# Patient Record
Sex: Female | Born: 1980 | Hispanic: No | Marital: Single | Smoking: Never smoker
Health system: Southern US, Community
[De-identification: ages and names within clinical notes are randomized; demographics above are authoritative.]

## PROBLEM LIST (undated history)

## (undated) ENCOUNTER — Inpatient Hospital Stay (HOSPITAL_COMMUNITY): Payer: Self-pay

## (undated) DIAGNOSIS — I959 Hypotension, unspecified: Secondary | ICD-10-CM

## (undated) HISTORY — PX: OVARIAN CYST REMOVAL: SHX89

---

## 2017-09-06 ENCOUNTER — Inpatient Hospital Stay (HOSPITAL_COMMUNITY): Payer: Self-pay

## 2017-09-06 ENCOUNTER — Other Ambulatory Visit: Payer: Self-pay

## 2017-09-06 ENCOUNTER — Inpatient Hospital Stay (HOSPITAL_COMMUNITY)
Admission: AD | Admit: 2017-09-06 | Discharge: 2017-09-06 | Disposition: A | Discharge status: Home / Self Care | Payer: Self-pay | Source: Ambulatory Visit | Attending: Obstetrics and Gynecology | Admitting: Obstetrics and Gynecology

## 2017-09-06 ENCOUNTER — Encounter (HOSPITAL_COMMUNITY): Payer: Self-pay | Admitting: *Deleted

## 2017-09-06 DIAGNOSIS — O26893 Other specified pregnancy related conditions, third trimester: Secondary | ICD-10-CM

## 2017-09-06 DIAGNOSIS — O0933 Supervision of pregnancy with insufficient antenatal care, third trimester: Secondary | ICD-10-CM | POA: Insufficient documentation

## 2017-09-06 DIAGNOSIS — R109 Unspecified abdominal pain: Secondary | ICD-10-CM

## 2017-09-06 DIAGNOSIS — K5900 Constipation, unspecified: Secondary | ICD-10-CM

## 2017-09-06 DIAGNOSIS — Z3A31 31 weeks gestation of pregnancy: Secondary | ICD-10-CM | POA: Insufficient documentation

## 2017-09-06 HISTORY — DX: Hypotension, unspecified: I95.9

## 2017-09-06 LAB — CBC WITH DIFFERENTIAL/PLATELET
Basophils Absolute: 0 10*3/uL (ref 0.0–0.1)
Basophils Relative: 0 %
Eosinophils Absolute: 0.2 10*3/uL (ref 0.0–0.7)
Eosinophils Relative: 2 %
HEMATOCRIT: 31.4 % — AB (ref 36.0–46.0)
HEMOGLOBIN: 10.3 g/dL — AB (ref 12.0–15.0)
LYMPHS ABS: 2.2 10*3/uL (ref 0.7–4.0)
Lymphocytes Relative: 27 %
MCH: 30 pg (ref 26.0–34.0)
MCHC: 32.8 g/dL (ref 30.0–36.0)
MCV: 91.5 fL (ref 78.0–100.0)
MONOS PCT: 6 %
Monocytes Absolute: 0.4 10*3/uL (ref 0.1–1.0)
NEUTROS ABS: 5.3 10*3/uL (ref 1.7–7.7)
NEUTROS PCT: 65 %
Platelets: 232 10*3/uL (ref 150–400)
RBC: 3.43 MIL/uL — ABNORMAL LOW (ref 3.87–5.11)
RDW: 13.4 % (ref 11.5–15.5)
WBC: 8 10*3/uL (ref 4.0–10.5)

## 2017-09-06 LAB — WET PREP, GENITAL
Clue Cells Wet Prep HPF POC: NONE SEEN
SPERM: NONE SEEN
Trich, Wet Prep: NONE SEEN
Yeast Wet Prep HPF POC: NONE SEEN

## 2017-09-06 LAB — URINALYSIS, ROUTINE W REFLEX MICROSCOPIC
BILIRUBIN URINE: NEGATIVE
GLUCOSE, UA: NEGATIVE mg/dL
Hgb urine dipstick: NEGATIVE
Ketones, ur: NEGATIVE mg/dL
LEUKOCYTES UA: NEGATIVE
NITRITE: NEGATIVE
PH: 7 (ref 5.0–8.0)
Protein, ur: NEGATIVE mg/dL
SPECIFIC GRAVITY, URINE: 1.011 (ref 1.005–1.030)

## 2017-09-06 MED ORDER — POLYETHYLENE GLYCOL 3350 17 GM/SCOOP PO POWD: 1.0000 | Freq: Once | ORAL | 0 refills | Status: AC

## 2017-09-06 MED ORDER — PRENATAL PLUS 27-1 MG PO TABS: 1.0000 | ORAL_TABLET | Freq: Every day | ORAL | 0 refills | Status: AC

## 2017-09-06 NOTE — Discharge Instructions (Signed)
Constipation, Adult °Constipation is when a person: °· Poops (has a bowel movement) fewer times in a week than normal. °· Has a hard time pooping. °· Has poop that is dry, hard, or bigger than normal. ° °Follow these instructions at home: °Eating and drinking ° °· Eat foods that have a lot of fiber, such as: °? Fresh fruits and vegetables. °? Whole grains. °? Beans. °· Eat less of foods that are high in fat, low in fiber, or overly processed, such as: °? French fries. °? Hamburgers. °? Cookies. °? Candy. °? Soda. °· Drink enough fluid to keep your pee (urine) clear or pale yellow. °General instructions °· Exercise regularly or as told by your doctor. °· Go to the restroom when you feel like you need to poop. Do not hold it in. °· Take over-the-counter and prescription medicines only as told by your doctor. These include any fiber supplements. °· Do pelvic floor retraining exercises, such as: °? Doing deep breathing while relaxing your lower belly (abdomen). °? Relaxing your pelvic floor while pooping. °· Watch your condition for any changes. °· Keep all follow-up visits as told by your doctor. This is important. °Contact a doctor if: °· You have pain that gets worse. °· You have a fever. °· You have not pooped for 4 days. °· You throw up (vomit). °· You are not hungry. °· You lose weight. °· You are bleeding from the anus. °· You have thin, pencil-like poop (stool). °Get help right away if: °· You have a fever, and your symptoms suddenly get worse. °· You leak poop or have blood in your poop. °· Your belly feels hard or bigger than normal (is bloated). °· You have very bad belly pain. °· You feel dizzy or you faint. °This information is not intended to replace advice given to you by your health care provider. Make sure you discuss any questions you have with your health care provider. °Document Released: 03/25/2008 Document Revised: 04/26/2016 Document Reviewed: 03/27/2016 °Elsevier Interactive Patient Education ©  2017 Elsevier Inc. ° °

## 2017-09-06 NOTE — MAU Note (Signed)
Pain in lower abd, down to her privates.  Pain started about 2 wks ago.  Comes and goes. 1st preg. No bleeding.  Clear d/c- 5 days. (pants have been wet).  Arrived from [place] on 11/14; plans to stay until the  Baby is 3 months old.

## 2017-09-06 NOTE — MAU Provider Note (Signed)
History   pt is G1 @ apx 31 wks recently here from [place] in with sharp shooting abd pains into vagina and rectum. Pt states has not had bowel movement in 3-4 days. Also only had 1/2 cup juice and sips of water today. Denies vag bleeding or ROM.  CSN: [id number]  Arrival date & time 09/06/17  1659   None     No chief complaint on file.   HPI  Past Medical History:  Diagnosis Date  . Hypotension     Past Surgical History:  Procedure Laterality Date  . OVARIAN CYST REMOVAL      No family history on file.  Social History   Tobacco Use  . Smoking status: Never Smoker  . Smokeless tobacco: Never Used  Substance Use Topics  . Alcohol use: No    Frequency: Never  . Drug use: No    OB History    Gravida Para Term Preterm AB Living   1             SAB TAB Ectopic Multiple Live Births                  Review of Systems  Constitutional: Negative.   HENT: Negative.   Eyes: Negative.   Respiratory: Negative.   Cardiovascular: Negative.   Gastrointestinal: Positive for constipation.  Endocrine: Negative.   Genitourinary: Positive for vaginal discharge and vaginal pain.  Musculoskeletal: Negative.   Skin: Negative.   Allergic/Immunologic: Negative.   Neurological: Negative.   Hematological: Negative.   Psychiatric/Behavioral: Negative.     Allergies  Patient has no known allergies.  Home Medications    BP 109/89 (BP Location: Right Arm)   Pulse 88   Temp 98.3 F (36.8 C) (Oral)   Resp 16   Wt 170 lb 4 oz (77.2 kg)   SpO2 100%   Physical Exam  Constitutional: She is oriented to person, place, and time. She appears well-developed and well-nourished.  HENT:  Head: Normocephalic.  Eyes: Pupils are equal, round, and reactive to light.  Neck: Normal range of motion.  Cardiovascular: Normal rate, regular rhythm, normal heart sounds and intact distal pulses.  Pulmonary/Chest: Effort normal and breath sounds normal.  Abdominal: Soft. Bowel sounds are normal.   Genitourinary: Vagina normal and uterus normal.  Musculoskeletal: Normal range of motion.  Neurological: She is alert and oriented to person, place, and time. She has normal reflexes.  Skin: Skin is warm and dry.  Psychiatric: She has a normal mood and affect. Her behavior is normal. Judgment and thought content normal.    MAU Course  Procedures (including critical care time)  Labs Reviewed  URINE CULTURE  WET PREP, GENITAL  URINALYSIS, ROUTINE W REFLEX MICROSCOPIC  CBC WITH DIFFERENTIAL/PLATELET  RPR  HIV ANTIBODY (ROUTINE TESTING)  ABO/RH  GC/CHLAMYDIA PROBE AMP ([facility]) NOT AT [facility]   No results found.   No diagnosis found.    MDM  VSS, FHR baseline 150's mod variability no decels. 10x10 accels. Cultures obtained, wet prep, prenatal labs, us . SVE rectal vault noted to be full of stool. Cervix firm/cl/post/high. Will d/c home in stable condition. messagwe sent to clinic [facility]

## 2017-09-07 LAB — HIV ANTIBODY (ROUTINE TESTING W REFLEX): HIV Screen 4th Generation wRfx: NONREACTIVE

## 2017-09-07 LAB — RPR: RPR: NONREACTIVE

## 2017-09-07 LAB — ABO/RH: ABO/RH(D): B POS

## 2017-09-08 LAB — GC/CHLAMYDIA PROBE AMP (~~LOC~~) NOT AT ARMC
Chlamydia: NEGATIVE
Neisseria Gonorrhea: NEGATIVE

## 2017-09-08 LAB — URINE CULTURE: CULTURE: NO GROWTH

## 2017-09-29 ENCOUNTER — Encounter: Payer: Self-pay | Admitting: Obstetrics & Gynecology

## 2017-10-16 ENCOUNTER — Encounter: Payer: Self-pay | Admitting: *Deleted

## 2017-10-16 NOTE — Progress Notes (Signed)
Patient presented to the front window @1000  this morning. She has a new ob appt scheduled tomorrow, however she stated that she thinks she is leaking fluids and feels as if her abdomen is actually shrinking. States baby is moving. I advised her to proceed to MAU to be checked. Patient refused to go there, stated she went there a month ago and got a bill for $2000, she cannot pay and she is worried about accruing costs. I reassured patient that she will be seen regardless of her ability to pay. Patient stated she would be happy if she could just get an ultrasound today. I told her that when she comes to her appointment tomorrow an u/s would be ordered but that I could not schedule one before then, even so, it would not be the same day. Again, encouraged patient to go to MAU if she thinks fluid is leaking, that it is important to the health of her and her baby to be seen. Patient became tearful and walked away.

## 2017-10-17 ENCOUNTER — Encounter: Payer: Self-pay | Admitting: Obstetrics & Gynecology

## 2017-10-17 ENCOUNTER — Ambulatory Visit (INDEPENDENT_AMBULATORY_CARE_PROVIDER_SITE_OTHER): Payer: Self-pay | Admitting: Obstetrics & Gynecology

## 2017-10-17 VITALS — BP 94/63 | HR 84 | Ht 66.0 in | Wt 163.3 lb

## 2017-10-17 DIAGNOSIS — Z3403 Encounter for supervision of normal first pregnancy, third trimester: Secondary | ICD-10-CM | POA: Insufficient documentation

## 2017-10-17 DIAGNOSIS — Z113 Encounter for screening for infections with a predominantly sexual mode of transmission: Secondary | ICD-10-CM

## 2017-10-17 DIAGNOSIS — Z23 Encounter for immunization: Secondary | ICD-10-CM

## 2017-10-17 NOTE — Progress Notes (Signed)
   PRENATAL VISIT NOTE  Subjective:  Alexandria Phillips is a 36 y.o. G1P0 at 3833w4d being seen today for transfer of prenatal care from [place].  Due to language barrier, an interpreter was present during the history-taking and subsequent discussion (and for part of the physical exam) with this patient. She is currently monitored for the following issues for this low-risk pregnancy and has Encounter for supervision of normal first pregnancy in third trimester on their problem list.  Patient reports possible watery discharge for one week.  Contractions: Irregular. Vag. Bleeding: None.  Movement: Present. Denies leaking of fluid.   The following portions of the patient's history were reviewed and updated as appropriate: allergies, current medications, past family history, past medical history, past social history, past surgical history and problem list. Problem list updated.  Objective:   Vitals:   10/17/17 1428 10/17/17 1438  BP: 94/63   Pulse: 84   Weight: 163 lb 4.8 oz (74.1 kg)   Height:  5\' 6"  (1.676 m)    Fetal Status: Fetal Heart Rate (bpm): 143 Fundal Height: 37 cm Movement: Present  Presentation: Vertex  General:  Alert, oriented and cooperative. Patient is in no acute distress.  Skin: Skin is warm and dry. No rash noted.   Cardiovascular: Normal heart rate noted  Respiratory: Normal respiratory effort, no problems with respiration noted  Abdomen: Soft, gravid, appropriate for gestational age.  Pain/Pressure: Present     Pelvic: Cervical exam performed Dilation: Closed Effacement (%): Thick Station: -3. No pooling seen in vagina, just thick white discharge. Sent for analysis.  Extremities: Normal range of motion.  Edema: None  Mental Status:  Normal mood and affect. Normal behavior. Normal judgment and thought content.   Assessment and Plan:  Pregnancy: G1P0 at 733w4d  1. Flu vaccine need - Flu Vaccine QUAD 36+ mos IM (Fluarix, Quad PF)  2. Encounter for supervision  of normal first pregnancy in third trimester - Cervicovaginal ancillary only - Hepatitis B Surface AntiGEN - Rubella screen - Antibody screen; Future - Glucose Tolerance, 1 Hour - Strep Gp B NAA  The nature of [facility] with multiple MDs and other Advanced Practice Providers was explained to patient; also emphasized that residents, students are part of our team.  No sign of SROM today.  Term labor symptoms and general obstetric precautions including but not limited to vaginal bleeding, contractions, leaking of fluid and fetal movement were reviewed in detail with the patient.  Please refer to After Visit Summary for other counseling recommendations.   Return in about 1 week (around 10/24/2017) for OB Visit (LOB).   Jaynie CollinsUgonna Samyukta Cura, MD

## 2017-10-17 NOTE — Patient Instructions (Signed)
Third Trimester of Pregnancy The third trimester is from week 28 through week 40 (months 7 through 9). The third trimester is a time when the unborn baby (fetus) is growing rapidly. At the end of the ninth month, the fetus is about 20 inches in length and weighs 6-10 pounds. Body changes during your third trimester Your body will continue to go through many changes during pregnancy. The changes vary from woman to woman. During the third trimester:  Your weight will continue to increase. You can expect to gain 25-35 pounds (11-16 kg) by the end of the pregnancy.  You may begin to get stretch marks on your hips, abdomen, and breasts.  You may urinate more often because the fetus is moving lower into your pelvis and pressing on your bladder.  You may develop or continue to have heartburn. This is caused by increased hormones that slow down muscles in the digestive tract.  You may develop or continue to have constipation because increased hormones slow digestion and cause the muscles that push waste through your intestines to relax.  You may develop hemorrhoids. These are swollen veins (varicose veins) in the rectum that can itch or be painful.  You may develop swollen, bulging veins (varicose veins) in your legs.  You may have increased body aches in the pelvis, back, or thighs. This is due to weight gain and increased hormones that are relaxing your joints.  You may have changes in your hair. These can include thickening of your hair, rapid growth, and changes in texture. Some women also have hair loss during or after pregnancy, or hair that feels dry or thin. Your hair will most likely return to normal after your baby is born.  Your breasts will continue to grow and they will continue to become tender. A yellow fluid (colostrum) may leak from your breasts. This is the first milk you are producing for your baby.  Your belly button may stick out.  You may notice more swelling in your hands,  face, or ankles.  You may have increased tingling or numbness in your hands, arms, and legs. The skin on your belly may also feel numb.  You may feel short of breath because of your expanding uterus.  You may have more problems sleeping. This can be caused by the size of your belly, increased need to urinate, and an increase in your body's metabolism.  You may notice the fetus "dropping," or moving lower in your abdomen (lightening).  You may have increased vaginal discharge.  You may notice your joints feel loose and you may have pain around your pelvic bone.  What to expect at prenatal visits You will have prenatal exams every 2 weeks until week 36. Then you will have weekly prenatal exams. During a routine prenatal visit:  You will be weighed to make sure you and the baby are growing normally.  Your blood pressure will be taken.  Your abdomen will be measured to track your baby's growth.  The fetal heartbeat will be listened to.  Any test results from the previous visit will be discussed.  You may have a cervical check near your due date to see if your cervix has softened or thinned (effaced).  You will be tested for Group B streptococcus. This happens between 35 and 37 weeks.  Your health care provider may ask you:  What your birth plan is.  How you are feeling.  If you are feeling the baby move.  If you have had   any abnormal symptoms, such as leaking fluid, bleeding, severe headaches, or abdominal cramping.  If you are using any tobacco products, including cigarettes, chewing tobacco, and electronic cigarettes.  If you have any questions.  Other tests or screenings that may be performed during your third trimester include:  Blood tests that check for low iron levels (anemia).  Fetal testing to check the health, activity level, and growth of the fetus. Testing is done if you have certain medical conditions or if there are problems during the  pregnancy.  Nonstress test (NST). This test checks the health of your baby to make sure there are no signs of problems, such as the baby not getting enough oxygen. During this test, a belt is placed around your belly. The baby is made to move, and its heart rate is monitored during movement.  What is false labor? False labor is a condition in which you feel small, irregular tightenings of the muscles in the womb (contractions) that usually go away with rest, changing position, or drinking water. These are called Braxton Hicks contractions. Contractions may last for hours, days, or even weeks before true labor sets in. If contractions come at regular intervals, become more frequent, increase in intensity, or become painful, you should see your health care provider. What are the signs of labor?  Abdominal cramps.  Regular contractions that start at 10 minutes apart and become stronger and more frequent with time.  Contractions that start on the top of the uterus and spread down to the lower abdomen and back.  Increased pelvic pressure and dull back pain.  A watery or bloody mucus discharge that comes from the vagina.  Leaking of amniotic fluid. This is also known as your "water breaking." It could be a slow trickle or a gush. Let your health care provider know if it has a color or strange odor. If you have any of these signs, call your health care provider right away, even if it is before your due date. Follow these instructions at home: Medicines  Follow your health care provider's instructions regarding medicine use. Specific medicines may be either safe or unsafe to take during pregnancy.  Take a prenatal vitamin that contains at least 600 micrograms (mcg) of folic acid.  If you develop constipation, try taking a stool softener if your health care provider approves. Eating and drinking  Eat a balanced diet that includes fresh fruits and vegetables, whole grains, good sources of protein  such as meat, eggs, or tofu, and low-fat dairy. Your health care provider will help you determine the amount of weight gain that is right for you.  Avoid raw meat and uncooked cheese. These carry germs that can cause birth defects in the baby.  If you have low calcium intake from food, talk to your health care provider about whether you should take a daily calcium supplement.  Eat four or five small meals rather than three large meals a day.  Limit foods that are high in fat and processed sugars, such as fried and sweet foods.  To prevent constipation: ? Drink enough fluid to keep your urine clear or pale yellow. ? Eat foods that are high in fiber, such as fresh fruits and vegetables, whole grains, and beans. Activity  Exercise only as directed by your health care provider. Most women can continue their usual exercise routine during pregnancy. Try to exercise for 30 minutes at least 5 days a week. Stop exercising if you experience uterine contractions.  Avoid heavy   lifting.  Do not exercise in extreme heat or humidity, or at high altitudes.  Wear low-heel, comfortable shoes.  Practice good posture.  You may continue to have sex unless your health care provider tells you otherwise. Relieving pain and discomfort  Take frequent breaks and rest with your legs elevated if you have leg cramps or low back pain.  Take warm sitz baths to soothe any pain or discomfort caused by hemorrhoids. Use hemorrhoid cream if your health care provider approves.  Wear a good support bra to prevent discomfort from breast tenderness.  If you develop varicose veins: ? Wear support pantyhose or compression stockings as told by your healthcare provider. ? Elevate your feet for 15 minutes, 3-4 times a day. Prenatal care  Write down your questions. Take them to your prenatal visits.  Keep all your prenatal visits as told by your health care provider. This is important. Safety  Wear your seat belt at  all times when driving.  Make a list of emergency phone numbers, including numbers for family, friends, the hospital, and police and fire departments. General instructions  Avoid cat litter boxes and soil used by cats. These carry germs that can cause birth defects in the baby. If you have a cat, ask someone to clean the litter box for you.  Do not travel far distances unless it is absolutely necessary and only with the approval of your health care provider.  Do not use hot tubs, steam rooms, or saunas.  Do not drink alcohol.  Do not use any products that contain nicotine or tobacco, such as cigarettes and e-cigarettes. If you need help quitting, ask your health care provider.  Do not use any medicinal herbs or unprescribed drugs. These chemicals affect the formation and growth of the baby.  Do not douche or use tampons or scented sanitary pads.  Do not cross your legs for long periods of time.  To prepare for the arrival of your baby: ? Take prenatal classes to understand, practice, and ask questions about labor and delivery. ? Make a trial run to the hospital. ? Visit the hospital and tour the maternity area. ? Arrange for maternity or paternity leave through employers. ? Arrange for family and friends to take care of pets while you are in the hospital. ? Purchase a rear-facing car seat and make sure you know how to install it in your car. ? Pack your hospital bag. ? Prepare the baby's nursery. Make sure to remove all pillows and stuffed animals from the baby's crib to prevent suffocation.  Visit your dentist if you have not gone during your pregnancy. Use a soft toothbrush to brush your teeth and be gentle when you floss. Contact a health care provider if:  You are unsure if you are in labor or if your water has broken.  You become dizzy.  You have mild pelvic cramps, pelvic pressure, or nagging pain in your abdominal area.  You have lower back pain.  You have persistent  nausea, vomiting, or diarrhea.  You have an unusual or bad smelling vaginal discharge.  You have pain when you urinate. Get help right away if:  Your water breaks before 37 weeks.  You have regular contractions less than 5 minutes apart before 37 weeks.  You have a fever.  You are leaking fluid from your vagina.  You have spotting or bleeding from your vagina.  You have severe abdominal pain or cramping.  You have rapid weight loss or weight gain.    You have shortness of breath with chest pain.  You notice sudden or extreme swelling of your face, hands, ankles, feet, or legs.  Your baby makes fewer than 10 movements in 2 hours.  You have severe headaches that do not go away when you take medicine.  You have vision changes. Summary  The third trimester is from week 28 through week 40, months 7 through 9. The third trimester is a time when the unborn baby (fetus) is growing rapidly.  During the third trimester, your discomfort may increase as you and your baby continue to gain weight. You may have abdominal, leg, and back pain, sleeping problems, and an increased need to urinate.  During the third trimester your breasts will keep growing and they will continue to become tender. A yellow fluid (colostrum) may leak from your breasts. This is the first milk you are producing for your baby.  False labor is a condition in which you feel small, irregular tightenings of the muscles in the womb (contractions) that eventually go away. These are called Braxton Hicks contractions. Contractions may last for hours, days, or even weeks before true labor sets in.  Signs of labor can include: abdominal cramps; regular contractions that start at 10 minutes apart and become stronger and more frequent with time; watery or bloody mucus discharge that comes from the vagina; increased pelvic pressure and dull back pain; and leaking of amniotic fluid. This information is not intended to replace advice  given to you by your health care provider. Make sure you discuss any questions you have with your health care provider. Document Released: 10/01/2001 Document Revised: 03/14/2016 Document Reviewed: 12/08/2012 Elsevier Interactive Patient Education  2017 Elsevier Inc.  

## 2017-10-18 LAB — GLUCOSE TOLERANCE, 1 HOUR: GLUCOSE, 1HR PP: 115 mg/dL (ref 65–199)

## 2017-10-18 LAB — HEPATITIS B SURFACE ANTIGEN: HEP B S AG: NEGATIVE

## 2017-10-18 LAB — RUBELLA SCREEN: Rubella Antibodies, IGG: 25.9 index (ref >0.99)

## 2017-10-19 ENCOUNTER — Encounter: Payer: Self-pay | Admitting: Obstetrics and Gynecology

## 2017-10-19 DIAGNOSIS — O99820 Streptococcus B carrier state complicating pregnancy: Secondary | ICD-10-CM | POA: Insufficient documentation

## 2017-10-19 LAB — STREP GP B NAA: Strep Gp B NAA: POSITIVE — AB

## 2017-10-21 NOTE — L&D Delivery Note (Signed)
Delivery Note At 9:26 PM a viable female was delivered via Vaginal, Spontaneous (Presentation: verex; OA ) over an intact perineum c/b small 2nd degree tear.  APGAR: 8, 9; weight pending. Placenta delivered intact shortly afterward without incident, bleeding abated with fundal massage and IV pitocin.  Placenta status: intact.  Cord: 3 vessel without complications: .Nuchal x 1    Anesthesia: epidural  Episiotomy: None Lacerations: 2nd degree perineal Suture Repair: 3.0 vicryl Est. Blood Loss (mL): 300  Mom to postpartum.  Baby to Couplet care / Skin to Skin.  Alroy Bailiffarker W Leland 11/05/2017, 10:09 PM  I confirm that I have verified the information documented in the resident's note and that I have also personally reperformed the physical exam and all medical decision making activities.  I was gloved and present for entire delivery SVD without incident No difficulty with shoulders There was a nuchal cord x 1, delivered through Lacerations as listed above Repair of same supervised by me Aviva SignsMarie L Radley Barto, CNM

## 2017-10-22 LAB — CERVICOVAGINAL ANCILLARY ONLY
Bacterial vaginitis: NEGATIVE
Candida vaginitis: NEGATIVE
Chlamydia: NEGATIVE
Neisseria Gonorrhea: NEGATIVE
Trichomonas: NEGATIVE

## 2017-10-28 ENCOUNTER — Ambulatory Visit (INDEPENDENT_AMBULATORY_CARE_PROVIDER_SITE_OTHER): Payer: Self-pay | Admitting: Medical

## 2017-10-28 ENCOUNTER — Other Ambulatory Visit: Payer: Self-pay | Admitting: Medical

## 2017-10-28 DIAGNOSIS — Z3403 Encounter for supervision of normal first pregnancy, third trimester: Secondary | ICD-10-CM

## 2017-10-28 NOTE — Patient Instructions (Signed)

## 2017-10-28 NOTE — Progress Notes (Signed)
   PRENATAL VISIT NOTE  Subjective:  Alexandria Phillips is a 37 y.o. G1P0 at 3379w1d being seen today for ongoing prenatal care.  She is currently monitored for the following issues for this low-risk pregnancy and has Encounter for supervision of normal first pregnancy in third trimester and GBS (group B Streptococcus carrier), +RV culture, currently pregnant on their problem list.  Patient reports no complaints.  Contractions: Irritability. Vag. Bleeding: None.  Movement: Present. Denies leaking of fluid.   The following portions of the patient's history were reviewed and updated as appropriate: allergies, current medications, past family history, past medical history, past social history, past surgical history and problem list. Problem list updated.  Objective:   Vitals:   10/28/17 1443  BP: 95/66  Pulse: 85  Weight: 164 lb (74.4 kg)    Fetal Status: Fetal Heart Rate (bpm): 152 Fundal Height: 38 cm Movement: Present  Presentation: Vertex  General:  Alert, oriented and cooperative. Patient is in no acute distress.  Skin: Skin is warm and dry. No rash noted.   Cardiovascular: Normal heart rate noted  Respiratory: Normal respiratory effort, no problems with respiration noted  Abdomen: Soft, gravid, appropriate for gestational age.  Pain/Pressure: Present     Pelvic: Cervical exam performed Dilation: Closed Effacement (%): Thick Station: -3  Extremities: Normal range of motion.  Edema: None  Mental Status:  Normal mood and affect. Normal behavior. Normal judgment and thought content.   Assessment and Plan:  Pregnancy: G1P0 at 179w1d  1. Encounter for supervision of normal first pregnancy in third trimester - IOL scheduled for 11/10/17 at 41 weeks - + GBS discussed   Term labor symptoms and general obstetric precautions including but not limited to vaginal bleeding, contractions, leaking of fluid and fetal movement were reviewed in detail with the patient. Please refer to  After Visit Summary for other counseling recommendations.  Return in about 1 week (around 11/04/2017) for LOB, NST/BPP.   Vonzella NippleJulie Suzanna Zahn, PA-C

## 2017-10-29 ENCOUNTER — Telehealth (HOSPITAL_COMMUNITY): Payer: Self-pay | Admitting: *Deleted

## 2017-10-29 NOTE — Telephone Encounter (Signed)
Preadmission screen Interpreter number [phone]

## 2017-11-04 ENCOUNTER — Encounter: Payer: Self-pay | Admitting: *Deleted

## 2017-11-04 ENCOUNTER — Encounter (HOSPITAL_COMMUNITY): Payer: Self-pay | Admitting: *Deleted

## 2017-11-04 ENCOUNTER — Inpatient Hospital Stay (HOSPITAL_COMMUNITY)
Admission: AD | Admit: 2017-11-04 | Discharge: 2017-11-07 | DRG: 807 | Disposition: A | Discharge status: Home / Self Care | Payer: Medicaid Other | Source: Ambulatory Visit | Attending: Family Medicine | Admitting: Family Medicine

## 2017-11-04 DIAGNOSIS — O99820 Streptococcus B carrier state complicating pregnancy: Secondary | ICD-10-CM

## 2017-11-04 DIAGNOSIS — Z3A40 40 weeks gestation of pregnancy: Secondary | ICD-10-CM

## 2017-11-04 DIAGNOSIS — O99824 Streptococcus B carrier state complicating childbirth: Secondary | ICD-10-CM | POA: Diagnosis present

## 2017-11-04 DIAGNOSIS — O480 Post-term pregnancy: Secondary | ICD-10-CM | POA: Diagnosis present

## 2017-11-04 NOTE — MAU Note (Signed)
Pt presents to MAU c/o ctx that were spaced out at q2320min that have now worsened to every 3-6 min pt states they got worse over the past few hours. Pt denies bleeding or LOF. Pt reports decreased FM. Pt reports ctx pain 5/10. Pt denies any problems in the pregnancy other than GBS+.

## 2017-11-05 ENCOUNTER — Inpatient Hospital Stay (HOSPITAL_COMMUNITY): Payer: Medicaid Other

## 2017-11-05 ENCOUNTER — Inpatient Hospital Stay (HOSPITAL_COMMUNITY): Payer: Medicaid Other | Admitting: Anesthesiology

## 2017-11-05 ENCOUNTER — Encounter (HOSPITAL_COMMUNITY): Payer: Self-pay | Admitting: *Deleted

## 2017-11-05 DIAGNOSIS — O480 Post-term pregnancy: Secondary | ICD-10-CM | POA: Diagnosis present

## 2017-11-05 DIAGNOSIS — O99824 Streptococcus B carrier state complicating childbirth: Secondary | ICD-10-CM | POA: Diagnosis present

## 2017-11-05 DIAGNOSIS — Z3A40 40 weeks gestation of pregnancy: Secondary | ICD-10-CM | POA: Diagnosis not present

## 2017-11-05 LAB — CBC
HCT: 34.1 % — ABNORMAL LOW (ref 36.0–46.0)
Hemoglobin: 11.4 g/dL — ABNORMAL LOW (ref 12.0–15.0)
MCH: 28.4 pg (ref 26.0–34.0)
MCHC: 33.4 g/dL (ref 30.0–36.0)
MCV: 84.8 fL (ref 78.0–100.0)
Platelets: 243 K/uL (ref 150–400)
RBC: 4.02 MIL/uL (ref 3.87–5.11)
RDW: 13.5 % (ref 11.5–15.5)
WBC: 12.3 K/uL — ABNORMAL HIGH (ref 4.0–10.5)

## 2017-11-05 LAB — TYPE AND SCREEN
ABO/RH(D): B POS
Antibody Screen: NEGATIVE

## 2017-11-05 IMAGING — US US MFM FETAL BPP W/O NON-STRESS
1 series · 11 of 11 positions shown · non-contrast
Comparison: none

[Series 1: us mfm fetal bpp w/o non-stress · 11 acquisitions, 11 frames shown]
[im 1/11]
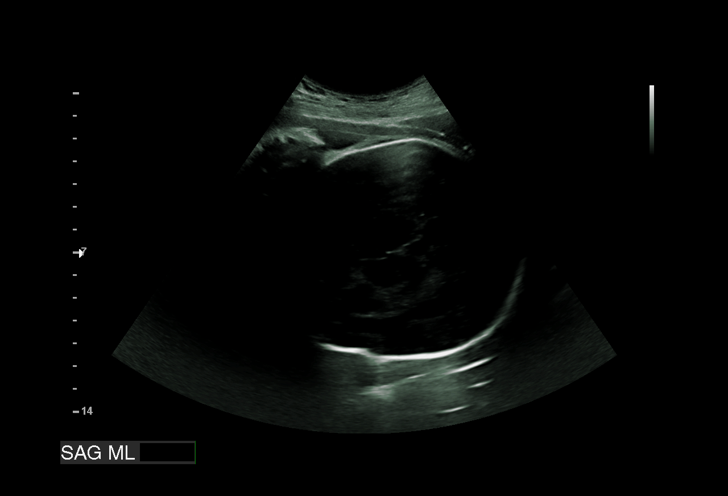
[im 2/11]
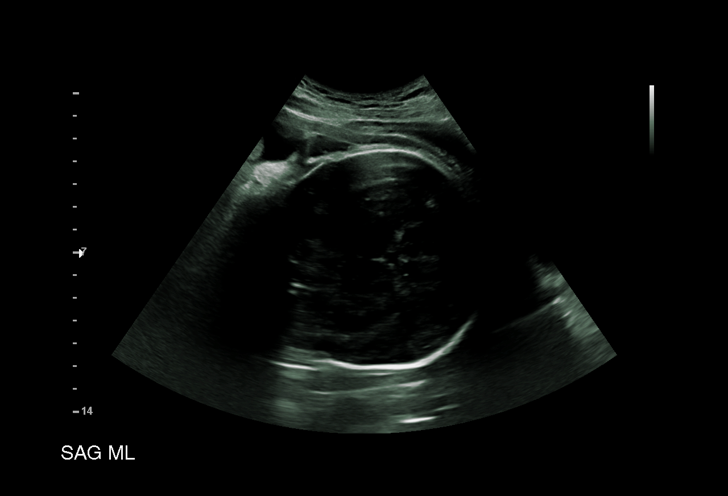
[im 3/11]
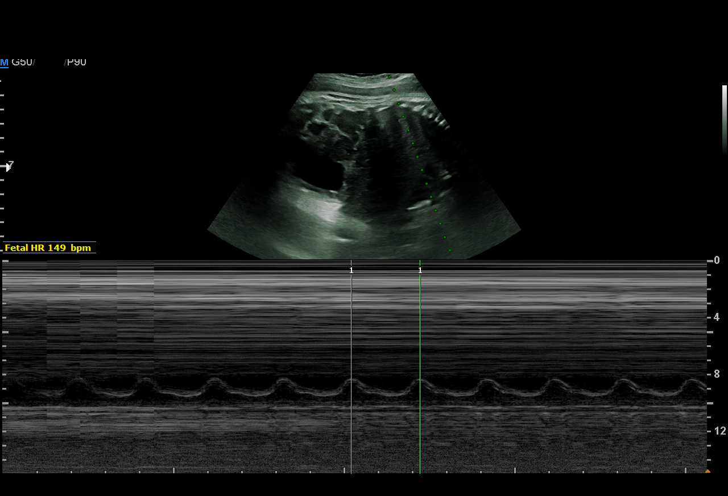
[im 4/11]
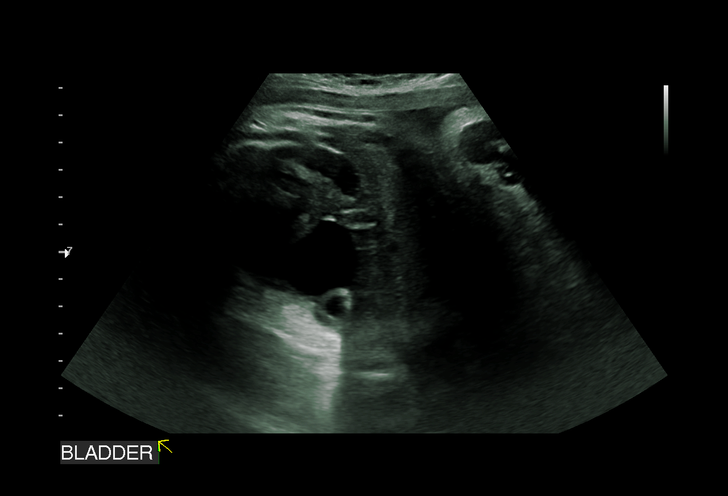
[im 5/11]
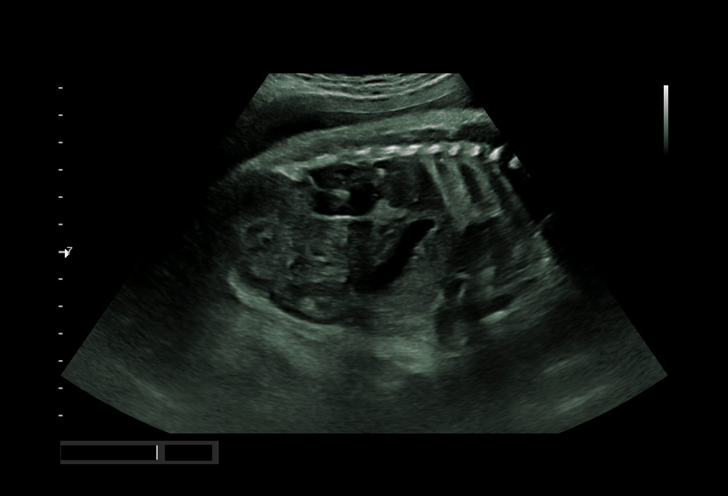
[im 6/11]
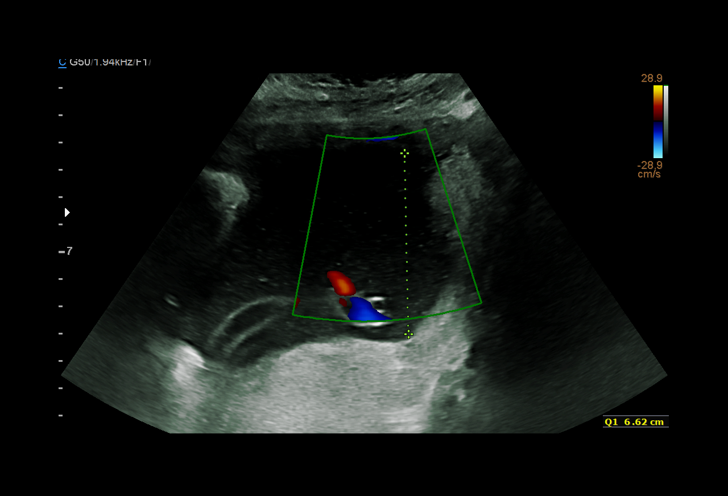
[im 7/11]
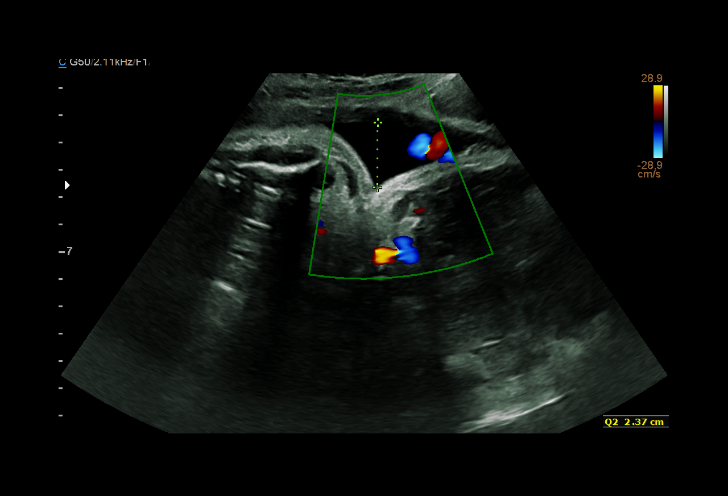
[im 8/11]
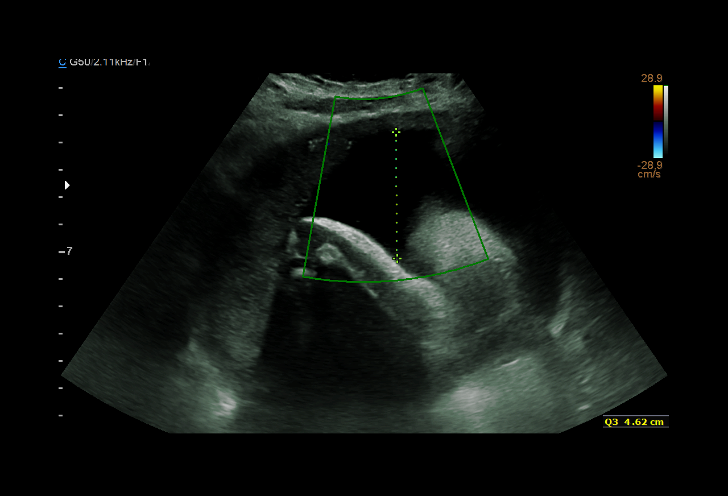
[im 9/11]
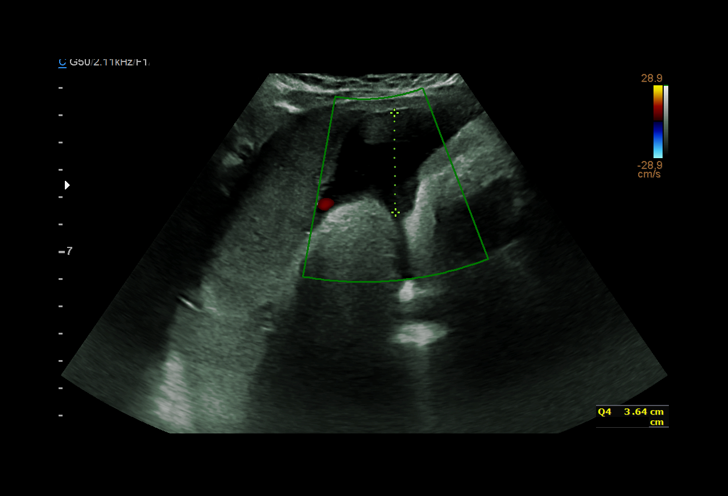
[im 10/11]
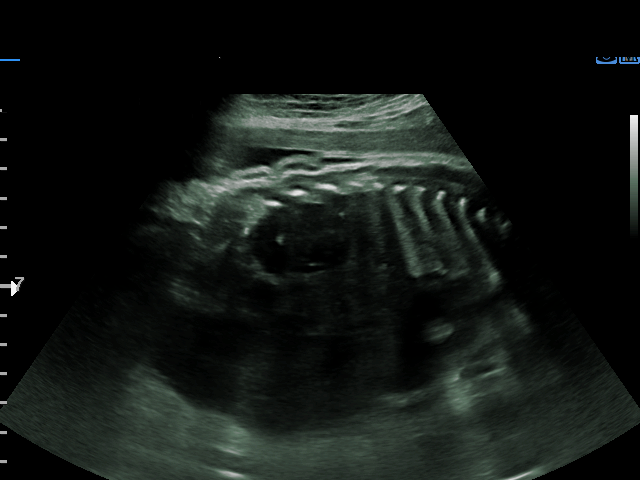
[im 11/11]
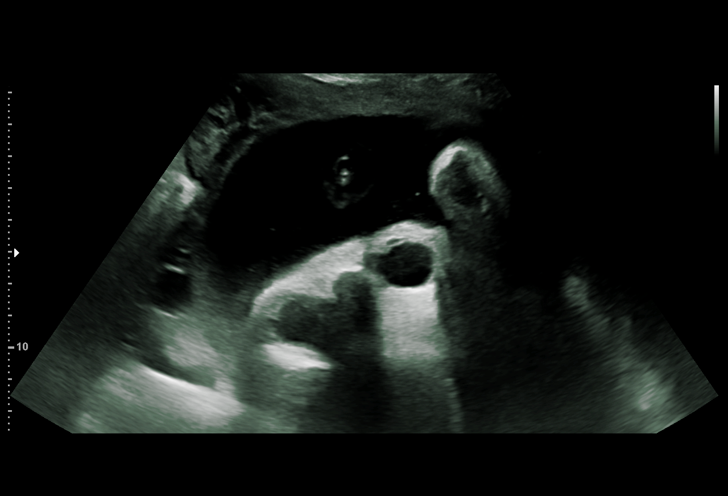

[11 of 11 positions shown; findings below may reference images not displayed]

[id number]

Indications

40 weeks gestation of pregnancy
Postdate pregnancy (40-42 weeks)               [77]
Decreased fetal movement                       [77]
Advanced maternal age primigravida 35+,        [77]
third trimester
OB History

Gravidity:    1         Term:   0        Prem:   0        SAB:   0
TOP:          0       Ectopic:  0        Living: 0
Fetal Evaluation

Num Of Fetuses:     1
Fetal Heart         149
Rate(bpm):
Cardiac Activity:   Observed
Presentation:       Cephalic

Amniotic Fluid
AFI FV:      Subjectively within normal limits

AFI Sum(cm)     %Tile       Largest Pocket(cm)
17.25           77

RUQ(cm)       RLQ(cm)       LUQ(cm)        LLQ(cm)
6.62
Biophysical Evaluation

Amniotic F.V:   Within normal limits       F. Tone:        Observed
F. Movement:    Observed                   Score:          [DATE]
F. Breathing:   Not Observed
Gestational Age

LMP:           40w 2d        Date:  [DATE]                 EDD:   [DATE]
Best:          40w 2d     Det. By:  LMP  ([DATE])          EDD:   [DATE]
Anatomy

Stomach:               Appears normal, left   Bladder:                Appears normal
sided
Impression

Intrauterine pregnancy at 40+2 weeks with decreased fetal
movement
Normal amniotic fluid
BPP [DATE] with no fetal breathing noted
Recommendations

Continue clinical evaluation and management.

## 2017-11-05 MED ORDER — PENICILLIN G POTASSIUM 5000000 UNITS IJ SOLR
5.0000 10*6.[IU] | Freq: Once | INTRAVENOUS | Status: AC
  Administered 2017-11-05: 5 10*6.[IU] via INTRAVENOUS
  Filled 2017-11-05: qty 5

## 2017-11-05 MED ORDER — LACTATED RINGERS IV SOLN
500.0000 mL | INTRAVENOUS | Status: DC | PRN
  Administered 2017-11-05 (×2): 500 mL via INTRAVENOUS

## 2017-11-05 MED ORDER — BENZOCAINE-MENTHOL 20-0.5 % EX AERO
1.0000 "application " | INHALATION_SPRAY | CUTANEOUS | Status: DC | PRN
  Filled 2017-11-05 (×2): qty 56

## 2017-11-05 MED ORDER — EPHEDRINE 5 MG/ML INJ: 10.0000 mg | INTRAVENOUS | Status: DC | PRN

## 2017-11-05 MED ORDER — OXYCODONE-ACETAMINOPHEN 5-325 MG PO TABS: 1.0000 | ORAL_TABLET | ORAL | Status: DC | PRN

## 2017-11-05 MED ORDER — LACTATED RINGERS IV SOLN
500.0000 mL | Freq: Once | INTRAVENOUS | Status: AC
  Administered 2017-11-05: 500 mL via INTRAVENOUS

## 2017-11-05 MED ORDER — FENTANYL 2.5 MCG/ML BUPIVACAINE 1/10 % EPIDURAL INFUSION (WH - ANES)
INTRAMUSCULAR | Status: AC
  Filled 2017-11-05: qty 100

## 2017-11-05 MED ORDER — DIPHENHYDRAMINE HCL 25 MG PO CAPS: 25.0000 mg | ORAL_CAPSULE | Freq: Four times a day (QID) | ORAL | Status: DC | PRN

## 2017-11-05 MED ORDER — TETANUS-DIPHTH-ACELL PERTUSSIS 5-2.5-18.5 LF-MCG/0.5 IM SUSP
0.5000 mL | Freq: Once | INTRAMUSCULAR | Status: DC
  Filled 2017-11-05: qty 0.5

## 2017-11-05 MED ORDER — DIBUCAINE 1 % RE OINT
1.0000 "application " | TOPICAL_OINTMENT | RECTAL | Status: DC | PRN
  Filled 2017-11-05 (×2): qty 28

## 2017-11-05 MED ORDER — COCONUT OIL OIL
1.0000 "application " | TOPICAL_OIL | Status: DC | PRN
  Filled 2017-11-05: qty 120

## 2017-11-05 MED ORDER — PHENYLEPHRINE 40 MCG/ML (10ML) SYRINGE FOR IV PUSH (FOR BLOOD PRESSURE SUPPORT)
PREFILLED_SYRINGE | INTRAVENOUS | Status: AC
  Filled 2017-11-05: qty 20

## 2017-11-05 MED ORDER — ACETAMINOPHEN 500 MG PO TABS
1000.0000 mg | ORAL_TABLET | Freq: Once | ORAL | Status: DC
  Filled 2017-11-05: qty 2

## 2017-11-05 MED ORDER — ACETAMINOPHEN 325 MG PO TABS
650.0000 mg | ORAL_TABLET | ORAL | Status: DC | PRN
  Administered 2017-11-06: 650 mg via ORAL
  Filled 2017-11-05: qty 2

## 2017-11-05 MED ORDER — OXYCODONE-ACETAMINOPHEN 5-325 MG PO TABS: 2.0000 | ORAL_TABLET | ORAL | Status: DC | PRN

## 2017-11-05 MED ORDER — ACETAMINOPHEN 325 MG PO TABS: 650.0000 mg | ORAL_TABLET | ORAL | Status: DC | PRN

## 2017-11-05 MED ORDER — PHENYLEPHRINE 40 MCG/ML (10ML) SYRINGE FOR IV PUSH (FOR BLOOD PRESSURE SUPPORT): 80.0000 ug | PREFILLED_SYRINGE | INTRAVENOUS | Status: DC | PRN

## 2017-11-05 MED ORDER — SOD CITRATE-CITRIC ACID 500-334 MG/5ML PO SOLN: 30.0000 mL | ORAL | Status: DC | PRN

## 2017-11-05 MED ORDER — TERBUTALINE SULFATE 1 MG/ML IJ SOLN: 0.2500 mg | Freq: Once | INTRAMUSCULAR | Status: DC | PRN

## 2017-11-05 MED ORDER — SENNOSIDES-DOCUSATE SODIUM 8.6-50 MG PO TABS
2.0000 | ORAL_TABLET | ORAL | Status: DC
  Filled 2017-11-05: qty 2

## 2017-11-05 MED ORDER — LACTATED RINGERS IV SOLN
INTRAVENOUS | Status: DC
  Administered 2017-11-05 (×4): via INTRAVENOUS

## 2017-11-05 MED ORDER — ONDANSETRON HCL 4 MG/2ML IJ SOLN: 4.0000 mg | INTRAMUSCULAR | Status: DC | PRN

## 2017-11-05 MED ORDER — OXYTOCIN BOLUS FROM INFUSION
500.0000 mL | Freq: Once | INTRAVENOUS | Status: AC
  Administered 2017-11-05: 500 mL via INTRAVENOUS

## 2017-11-05 MED ORDER — FLEET ENEMA 7-19 GM/118ML RE ENEM: 1.0000 | ENEMA | RECTAL | Status: DC | PRN

## 2017-11-05 MED ORDER — WITCH HAZEL-GLYCERIN EX PADS: 1.0000 "application " | MEDICATED_PAD | CUTANEOUS | Status: DC | PRN

## 2017-11-05 MED ORDER — OXYTOCIN 40 UNITS IN LACTATED RINGERS INFUSION - SIMPLE MED
2.5000 [IU]/h | INTRAVENOUS | Status: DC
  Administered 2017-11-05: 2.5 [IU]/h via INTRAVENOUS

## 2017-11-05 MED ORDER — LIDOCAINE HCL (PF) 1 % IJ SOLN
30.0000 mL | INTRAMUSCULAR | Status: DC | PRN
  Administered 2017-11-05: 30 mL via SUBCUTANEOUS
  Filled 2017-11-05: qty 30

## 2017-11-05 MED ORDER — IBUPROFEN 600 MG PO TABS
600.0000 mg | ORAL_TABLET | Freq: Four times a day (QID) | ORAL | Status: DC
  Administered 2017-11-06 – 2017-11-07 (×4): 600 mg via ORAL
  Filled 2017-11-05 (×5): qty 1

## 2017-11-05 MED ORDER — OXYTOCIN 40 UNITS IN LACTATED RINGERS INFUSION - SIMPLE MED
1.0000 m[IU]/min | INTRAVENOUS | Status: DC
  Administered 2017-11-05: 2 m[IU]/min via INTRAVENOUS
  Filled 2017-11-05: qty 1000

## 2017-11-05 MED ORDER — ONDANSETRON HCL 4 MG/2ML IJ SOLN: 4.0000 mg | Freq: Four times a day (QID) | INTRAMUSCULAR | Status: DC | PRN

## 2017-11-05 MED ORDER — OXYCODONE-ACETAMINOPHEN 5-325 MG PO TABS
1.0000 | ORAL_TABLET | ORAL | Status: DC | PRN
  Administered 2017-11-07: 1 via ORAL
  Filled 2017-11-05: qty 1

## 2017-11-05 MED ORDER — LIDOCAINE HCL (PF) 1 % IJ SOLN
INTRAMUSCULAR | Status: DC | PRN
  Administered 2017-11-05 (×2): 4 mL via EPIDURAL

## 2017-11-05 MED ORDER — PRENATAL MULTIVITAMIN CH
1.0000 | ORAL_TABLET | Freq: Every day | ORAL | Status: DC
  Administered 2017-11-06: 1 via ORAL
  Filled 2017-11-05 (×2): qty 1

## 2017-11-05 MED ORDER — PENICILLIN G POT IN DEXTROSE 60000 UNIT/ML IV SOLN
3.0000 10*6.[IU] | INTRAVENOUS | Status: DC
  Administered 2017-11-05 (×3): 3 10*6.[IU] via INTRAVENOUS
  Filled 2017-11-05 (×4): qty 50

## 2017-11-05 MED ORDER — PHENYLEPHRINE 40 MCG/ML (10ML) SYRINGE FOR IV PUSH (FOR BLOOD PRESSURE SUPPORT)
80.0000 ug | PREFILLED_SYRINGE | INTRAVENOUS | Status: DC | PRN
  Administered 2017-11-05: 80 ug via INTRAVENOUS

## 2017-11-05 MED ORDER — ZOLPIDEM TARTRATE 5 MG PO TABS: 5.0000 mg | ORAL_TABLET | Freq: Every evening | ORAL | Status: DC | PRN

## 2017-11-05 MED ORDER — ONDANSETRON HCL 4 MG PO TABS: 4.0000 mg | ORAL_TABLET | ORAL | Status: DC | PRN

## 2017-11-05 MED ORDER — FENTANYL 2.5 MCG/ML BUPIVACAINE 1/10 % EPIDURAL INFUSION (WH - ANES)
14.0000 mL/h | INTRAMUSCULAR | Status: DC | PRN
  Administered 2017-11-05 (×3): 14 mL/h via EPIDURAL
  Filled 2017-11-05: qty 100

## 2017-11-05 MED ORDER — DIPHENHYDRAMINE HCL 50 MG/ML IJ SOLN: 12.5000 mg | INTRAMUSCULAR | Status: DC | PRN

## 2017-11-05 MED ORDER — SIMETHICONE 80 MG PO CHEW: 80.0000 mg | CHEWABLE_TABLET | ORAL | Status: DC | PRN

## 2017-11-05 NOTE — Anesthesia Procedure Notes (Signed)
Epidural Patient location during procedure: OB Start time: 11/05/2017 9:04 AM End time: 11/05/2017 9:28 AM  Staffing Anesthesiologist: Heather RobertsSinger, Brexley Cutshaw, MD Performed: anesthesiologist   Preanesthetic Checklist Completed: patient identified, site marked, pre-op evaluation, timeout performed, IV checked, risks and benefits discussed and monitors and equipment checked  Epidural Patient position: sitting Prep: DuraPrep Patient monitoring: heart rate, cardiac monitor, continuous pulse ox and blood pressure Approach: midline Location: L2-L3 Injection technique: LOR saline  Needle:  Needle type: Tuohy  Needle gauge: 17 G Needle length: 9 cm Needle insertion depth: 6 cm Catheter size: 20 Guage Catheter at skin depth: 10 cm Test dose: negative and Other  Assessment Events: blood not aspirated, injection not painful, no injection resistance and negative IV test  Additional Notes Informed consent obtained prior to proceeding including risk of failure, 1% risk of PDPH, risk of minor discomfort and bruising.  Discussed rare but serious complications including epidural abscess, permanent nerve injury, epidural hematoma.  Discussed alternatives to epidural analgesia and patient desires to proceed.  Timeout performed pre-procedure verifying patient name, procedure, and platelet count.  Patient tolerated procedure well.

## 2017-11-05 NOTE — H&P (Signed)
OBSTETRIC ADMISSION HISTORY AND PHYSICAL  Alexandria Phillips is a 37 y.o. female G1P0 with IUP at 3745w2d by LMP vs uncler, c/b indeterminant amount of prenatal care presenting for frequent contractions, pain 5/10. Interview difficult given specific dialect of french and trouble with translator. She reports +FMs, No LOF, no VB, no blurry vision, headaches or peripheral edema, and RUQ pain.  She plans on breastfeeding. She plans on NFP. [facility]   Dating: By LMP --->  Estimated Date of Delivery: 11/03/17  Sono:  @[redacted]w[redacted]d , CWD, normal anatomy, 58% EFW   Prenatal History/Complications:  Past Medical History: Past Medical History:  Diagnosis Date  . Hypotension     Past Surgical History: Past Surgical History:  Procedure Laterality Date  . OVARIAN CYST REMOVAL      Obstetrical History: OB History    Gravida Para Term Preterm AB Living   1             SAB TAB Ectopic Multiple Live Births                  Social History: Social History   Socioeconomic History  . Marital status: Single    Spouse name: None  . Number of children: None  . Years of education: None  . Highest education level: None  Social Needs  . Financial resource strain: None  . Food insecurity - worry: None  . Food insecurity - inability: None  . Transportation needs - medical: None  . Transportation needs - non-medical: None  Occupational History  . None  Tobacco Use  . Smoking status: Never Smoker  . Smokeless tobacco: Never Used  Substance and Sexual Activity  . Alcohol use: No    Frequency: Never  . Drug use: No  . Sexual activity: Yes  Other Topics Concern  . None  Social History Narrative  . None    Family History: No family history on file.  Allergies: No Known Allergies  Medications Prior to Admission  Medication Sig Dispense Refill Last Dose  . prenatal vitamin w/FE, FA (PRENATAL 1 + 1) 27-1 MG TABS tablet Take 1 tablet daily at 12 noon by  mouth. (Patient not taking: Reported on 10/17/2017) 30 each 0 Not Taking     Review of Systems   All systems reviewed and negative except as stated in HPI  Blood pressure 110/61, pulse 86, temperature 98.4 F (36.9 C), temperature source Oral, resp. rate 17, weight 76.2 kg (168 lb). General appearance: alert, cooperative, appears stated age and no distress Lungs: clear to auscultation bilaterally Heart: regular rate and rhythm Abdomen: soft, non-tender; bowel sounds normal Extremities: Homans sign is negative, no sign of DVT Presentation: unsure Fetal monitoringBaseline: 140 bpm, Variability: Good {> 6 bpm), Accelerations: Reactive and Decelerations: Early occasional Uterine activityFrequency: Every 3-5 minutes Dilation: 2.5 Effacement (%): 90, 80 Station: Ballotable, -3 Exam by:: lauren fields rnb    Prenatal labs: ABO, Rh: --/--/B POS (11/17 1835) Antibody:   Rubella: 25.90 (12/28 1627) RPR: Non Reactive (11/17 1835)  HBsAg: Negative (12/28 1627)  HIV: Non Reactive (11/17 1835)  GBS: Positive (12/28 1557)  1 hr Glucola WNL Genetic screening  WNL Anatomy US WNL  Prenatal Transfer Tool  Maternal Diabetes: No Genetic Screening: Normal Maternal Ultrasounds/Referrals: Normal Fetal Ultrasounds or other Referrals:  None Maternal Substance Abuse:  No Significant Maternal Medications:  None Significant Maternal Lab Results: Lab values include: Group B Strep positive  No results found for this or  any previous visit (from the past 24 hour(s)).  Patient Active Problem List   Diagnosis Date Noted  . GBS (group B Streptococcus carrier), +RV culture, currently pregnant 10/19/2017  . Encounter for supervision of normal first pregnancy in third trimester 10/17/2017    Assessment/Plan:  Alexandria Phillips is a 37 y.o. G1P0 at [redacted]w[redacted]d here for contractions  #Labor: in latent labor. Past due date. BPP 6/8 (no breathing). Will plan to augment labor with  pitocin #Pain: uncomfortable #FWB:  Reactive NST, BPP 6/8 as above #ID: GBS + #MOF: breast #MOC: NFP #Circ:  Sex unknown  Alroy Bailiff, MD  11/05/2017, 4:31 AM   I spoke with and examined patient and agree with resident/PA/SNM's note and plan of care.  Pt came in for r/o labor, was 1cm>1.5>2>2.5cm over the course of her mau stay. Baby not completely reactive, but reassuring- so sent for BPP while trying to determine if she needed to be admitted. BPP 6/8 (-2 for breathing), discussed w/ Dr. Adrian Blackwater, will admit. Monitor for need for augmentation, for now plan expectant management. PCN for GBS+. Cheral Marker, CNM, WHNP-BC 11/06/2017 9:22 AM

## 2017-11-05 NOTE — Progress Notes (Signed)
Patient ID: Alexandria Phillips, female   DOB: 04/10/1981, 37 y.o.   MRN: [id number]  Reviewed patient's strip and labor curve.   EFM: 140/mod/ + accels, decelerations that begin with the contraction and nadir within 30 seconds and return to baseline. Fetal HR drops from baseline to 110s with no drops below 100.  Toco: every 7-8 minutes   Plan: Labor: Discussed starting pitocin given ineffective contraction pattern. At this point there is no indication for operative delivery and patient has not had augmentation to determine whether she is arrest of dilation. Only intervention thus far has been AROM which did not improve contraction pattern.   FWB: overall reassuring given moderate variability. Category 2 strip. According to Parer et al. 2010, based on this current strip characteristics (normal baseline, moderate variability, and recurrent late decelerations that are 15-44bpm below baseline)  the current risk of acidemia is zero and the risk of progression is moderate and conservative measures and surveillance is warranted.  Continue close monitoring for progression.

## 2017-11-05 NOTE — Anesthesia Pain Management Evaluation Note (Signed)
  CRNA Pain Management Visit Note  Patient: Alexandria Phillips, 37 y.o., female  "Hello I am a member of the anesthesia team at [facility]. We have an anesthesia team available at all times to provide care throughout the hospital, including epidural management and anesthesia for C-section. I don't know your plan for the delivery whether it a natural birth, water birth, IV sedation, nitrous supplementation, doula or epidural, but we want to meet your pain goals."   1.Was your pain managed to your expectations on prior hospitalizations?   No prior hospitalizations  2.What is your expectation for pain management during this hospitalization?     Epidural  3.How can we help you reach that goal? Epidural intact and working well  Record the patient's initial score and the patient's pain goal.   Pain: 0  Pain Goal: 7 [facility] wants you to be able to say your pain was always managed very well.  Alexandria Phillips,Alexandria Phillips 11/05/2017

## 2017-11-05 NOTE — Anesthesia Preprocedure Evaluation (Signed)
Anesthesia Evaluation  Patient identified by MRN, date of birth, ID band Patient awake    Reviewed: Allergy & Precautions, NPO status , Patient's Chart, lab work & pertinent test results  History of Anesthesia Complications Negative for: history of anesthetic complications  Airway Mallampati: II  TM Distance: >3 FB Neck ROM: Full    Dental no notable dental hx. (+) Dental Advisory Given   Pulmonary neg pulmonary ROS,    Pulmonary exam normal        Cardiovascular negative cardio ROS Normal cardiovascular exam     Neuro/Psych negative neurological ROS  negative psych ROS   GI/Hepatic negative GI ROS, Neg liver ROS,   Endo/Other  negative endocrine ROS  Renal/GU negative Renal ROS  negative genitourinary   Musculoskeletal negative musculoskeletal ROS (+)   Abdominal   Peds negative pediatric ROS (+)  Hematology negative hematology ROS (+)   Anesthesia Other Findings   Reproductive/Obstetrics negative OB ROS                             Anesthesia Physical Anesthesia Plan  ASA: II  Anesthesia Plan: Epidural   Post-op Pain Management:    Induction:   PONV Risk Score and Plan:   Airway Management Planned: Natural Airway  Additional Equipment:   Intra-op Plan:   Post-operative Plan:   Informed Consent: I have reviewed the patients History and Physical, chart, labs and discussed the procedure including the risks, benefits and alternatives for the proposed anesthesia with the patient or authorized representative who has indicated his/her understanding and acceptance.     Plan Discussed with: Anesthesiologist  Anesthesia Plan Comments:         Anesthesia Quick Evaluation

## 2017-11-05 NOTE — Progress Notes (Signed)
Labor Progress Note  Alexandria Phillips is a 37 y.o. G1P0 at 5619w2d  admitted for latent labor  S: Patient is comfortable. Recently with epidural.   O:  BP 107/61   Pulse (!) 59   Temp 97.7 F (36.5 C) (Oral)   Resp 18   Wt 168 lb (76.2 kg)   SpO2 100%   BMI 27.12 kg/m   No intake/output data recorded.  FHT:  FHR: 130 bpm, variability: minimal ,  accelerations:  Abscent,  decelerations:  Present late UC:   regular, every 3-5 minutes SVE:   Dilation: 4.5 Effacement (%): 100 Station: -1 Exam by:: Dr. Doroteo GlassmanPhelps Membranes intact  Labs: Lab Results  Component Value Date   WBC 12.3 (H) 11/05/2017   HGB 11.4 (L) 11/05/2017   HCT 34.1 (L) 11/05/2017   MCV 84.8 11/05/2017   PLT 243 11/05/2017    Assessment / Plan: 37 y.o. G1P0 5119w2d in early labor Spontaneous labor, progressing normally  Labor: Latent labor, could AROM but will hold off due to NRFHT at this time Fetal Wellbeing:  Category II Pain Control:  Epidural Anticipated MOD:  NSVD  Expectant management. Dr. Alvester MorinNewton made aware. Will monitor closely. Possible need for c-section discussed.    Alexandria AdaJazma Christophor Eick, DO OB Fellow

## 2017-11-05 NOTE — Progress Notes (Signed)
Call placed to [employer] to verify that request for interpreter had been placed placed by [facility] .  Verified that call had been placed by the office of inclusion as a request for interpretive services for this patient.

## 2017-11-06 ENCOUNTER — Other Ambulatory Visit: Payer: Self-pay

## 2017-11-06 ENCOUNTER — Encounter: Payer: Self-pay | Admitting: Obstetrics & Gynecology

## 2017-11-06 ENCOUNTER — Encounter (HOSPITAL_COMMUNITY): Payer: Self-pay | Admitting: General Practice

## 2017-11-06 LAB — RPR: RPR: NONREACTIVE

## 2017-11-06 NOTE — Progress Notes (Signed)
Interpreter [phone] used to assess pt pain, update feedings, explain medications, explain STS, and answer pt questions.  Offered to order pt meal but pt declined, stating she had no appetite.

## 2017-11-06 NOTE — Progress Notes (Signed)
Video Interpreter [phone], Alexandria Phillips, used to introduce myself as the oncoming nurse to patient and explain care. Meal ordered. No other concerns at time. Newman PiesWiggins, Alexandria Burnstein T, RN

## 2017-11-06 NOTE — Progress Notes (Signed)
Post Partum Day 1 Subjective: no complaints, up ad lib, voiding, tolerating PO and + flatus  Objective: Blood pressure (!) 103/52, pulse 73, temperature 98.4 F (36.9 C), temperature source Oral, resp. rate 18, weight 168 lb (76.2 kg), SpO2 100 %, unknown if currently breastfeeding.  Physical Exam:  General: alert, cooperative and no distress Lochia: appropriate Uterine Fundus: firm Incision: no significant erythema DVT Evaluation: No evidence of DVT seen on physical exam.  Recent Labs    11/05/17 0500  HGB 11.4*  HCT 34.1*    Assessment/Plan: Plan for discharge tomorrow, Breastfeeding and Contraception Natural Family Planning   LOS: 1 day   Wynelle BourgeoisMarie Myrissa Chipley 11/06/2017, 6:54 AM

## 2017-11-06 NOTE — Anesthesia Postprocedure Evaluation (Signed)
Anesthesia Post Note  Patient: Alexandria Phillips Licensed conveyancerYansambou Boubacar  Procedure(s) Performed: AN AD HOC LABOR EPIDURAL     Patient location during evaluation: Mother Baby Anesthesia Type: Epidural Level of consciousness: awake and alert and oriented Pain management: satisfactory to patient Vital Signs Assessment: post-procedure vital signs reviewed and stable Respiratory status: respiratory function stable Cardiovascular status: stable Postop Assessment: no headache, no backache, epidural receding, patient able to bend at knees, no signs of nausea or vomiting and adequate PO intake Anesthetic complications: no    Last Vitals:  Vitals:   11/06/17 0030 11/06/17 0550  BP: (!) 97/54 (!) 103/52  Pulse: 67 73  Resp: 18 18  Temp: 36.6 C 36.9 C  SpO2:      Last Pain:  Vitals:   11/06/17 0553  TempSrc:   PainSc: 3    Pain Goal:                 Aziz Slape

## 2017-11-06 NOTE — Progress Notes (Signed)
Skipper ClicheAmina Tahirou, interpreter from [employer], was needed to interpret for patient at [facility] in MB room 137 from 12:00pm until 9:45pm

## 2017-11-06 NOTE — Progress Notes (Signed)
CSW acknowledges consult.  CSW attempted to meet with MOB, however MOB was receiving care from lactation and from the financial counseling department. CSW will return around 2:30pm.  Interpreter from [employer] will assist CSW with language barrier.    Blaine HamperAngel Phillips, MSW, LCSW Clinical Social Work [phone]

## 2017-11-06 NOTE — Lactation Note (Signed)
This note was copied from a baby's chart. Lactation Consultation Note  Patient Name: Alexandria Phillips ZOXWR'UToday's Date: 11/06/2017 Reason for consult: Initial assessment;Term;[facility] Breastfeeding consultation services and support information given to patient.  Baby is 5413 hours old.  Mom has flat nipples and baby is using a nipple shield to latch.  Assisted with positioning baby in football hold.  24 mm nipple shield applied.  Baby latched easily and deep with shield.  Observed active feeding for 10 minutes and baby still eating when I left the room.  Instructed to feed with cues and to call for assist prn.  Maternal Data Has patient been taught Hand Expression?: Yes Does the patient have breastfeeding experience prior to this delivery?: No  Feeding Feeding Type: Breast Fed  LATCH Score Latch: Grasps breast easily, tongue down, lips flanged, rhythmical sucking.(with nipple shield)  Audible Swallowing: A few with stimulation  Type of Nipple: Flat  Comfort (Breast/Nipple): Soft / non-tender  Hold (Positioning): Assistance needed to correctly position infant at breast and maintain latch.  LATCH Score: 7  Interventions Interventions: Breast feeding basics reviewed;Assisted with latch;Breast compression;Skin to skin;Adjust position;Breast massage;Support pillows;Hand express  Lactation Tools Discussed/Used Tools: Nipple Shields Nipple shield size: 24   Consult Status Consult Status: Follow-up Date: 11/07/17 Follow-up type: In-patient    Huston FoleyMOULDEN, [name] 11/06/2017, 10:46 AM

## 2017-11-06 NOTE — Progress Notes (Signed)
Skipper ClicheAmina Tahirou, interpreter from [employer], was needed to interpret for patient at [facility] in L&D room 174 from 1130 (per note) until 2330. She was needed on MBU room 137 from 2300 until 0030. Interpreter left her business card for staff to call if her services are needed tomorrow 10/1717.

## 2017-11-07 MED ORDER — IBUPROFEN 600 MG PO TABS: 600.0000 mg | ORAL_TABLET | Freq: Four times a day (QID) | ORAL | 0 refills | Status: AC

## 2017-11-07 NOTE — Discharge Summary (Signed)
OB Discharge Summary     Patient Name: Alexandria Phillips DOB: 1981/06/14 MRN: [id number]  Date of admission: 11/04/2017 Delivering MD: Alroy BailiffLELAND, PARKER W   Date of discharge: 11/07/2017  Admitting diagnosis: 9mth ctx Intrauterine pregnancy: 8067w2d     Secondary diagnosis:  Principal Problem:   SVD (spontaneous vaginal delivery) Active Problems:   GBS (group B Streptococcus carrier), +RV culture, currently pregnant   Post-dates pregnancy  Additional problems: GBS positive, BPP 6/8     Discharge diagnosis: post term pregnancy delivered                                                                                                Post partum procedures:none  Augmentation: AROM and Pitocin  Complications: None  Hospital course:  Induction of Labor With Vaginal Delivery   37 y.o. yo G1P1001 at 5567w2d was admitted to the hospital 11/04/2017 for induction of labor.  Indication for induction: post dates and BPP 6/8.  Patient had an uncomplicated labor course as follows: Membrane Rupture Time/Date: 12:38 PM ,11/05/2017   Intrapartum Procedures: Episiotomy: None [1]                                         Lacerations:  2nd degree [3]  Patient had delivery of a Viable infant.  Information for the patient's newborn:  Edward QualiaYansambou Phillips, Girl Egbert GaribaldiOumoulher [[id number]][[id number]]      11/05/2017  Details of delivery can be found in separate delivery note.  Patient had a routine postpartum course. Patient is discharged home 11/07/17.  Physical exam  Vitals:   11/06/17 0550 11/06/17 1547 11/06/17 1700 11/07/17 0530  BP: (!) 103/52 91/60 (!) 97/51 (!) 93/51  Pulse: 73 86 79 67  Resp: 18  18 16   Temp: 98.4 F (36.9 C) 98.3 F (36.8 C)  98.2 F (36.8 C)  TempSrc: Oral Oral  Oral  SpO2:  100%  99%  Weight:       General: alert and no distress Lochia: appropriate Uterine Fundus: firm Incision: N/A DVT Evaluation: No evidence of DVT seen on physical exam. No cords or calf tenderness. No  significant calf/ankle edema. Labs: Lab Results  Component Value Date   WBC 12.3 (H) 11/05/2017   HGB 11.4 (L) 11/05/2017   HCT 34.1 (L) 11/05/2017   MCV 84.8 11/05/2017   PLT 243 11/05/2017   No flowsheet data found.  Discharge instruction: per After Visit Summary and "Baby and Me Booklet".  After visit meds:  Allergies as of 11/07/2017   No Known Allergies     Medication List    TAKE these medications   ibuprofen 600 MG tablet Commonly known as:  ADVIL,MOTRIN Take 1 tablet (600 mg total) by mouth every 6 (six) hours.   prenatal vitamin w/FE, FA 27-1 MG Tabs tablet Take 1 tablet daily at 12 noon by mouth.       Diet: routine diet  Activity: Advance as tolerated. Pelvic rest for 6 weeks.   Outpatient follow up:4 weeks Follow  up Appt: Future Appointments  Date Time Provider Department Center  12/16/2017  9:55 AM Marny Lowenstein, PA-C [facility]   Postpartum contraception: Natural Family Planning  Newborn Data: Live born female  Birth Weight: 6 lb 12.6 oz (3080 g) APGAR: 8, 9  Newborn Delivery   Birth date/time:  11/05/2017 21:26:00 Delivery type:  Vaginal, Spontaneous     Baby Feeding: Bottle Disposition:home with mother   11/07/2017 Oralia Manis, DO  OB FELLOW DISCHARGE ATTESTATION  I have seen and examined this patient and agree with above documentation in the resident's note.   Frederik Pear, MD OB Fellow

## 2017-11-07 NOTE — Discharge Instructions (Signed)

## 2017-11-07 NOTE — Clinical Social Work Maternal (Addendum)
CLINICAL SOCIAL WORK MATERNAL/CHILD NOTE  Patient Details  Name: Alexandria Phillips MRN: [id number] Date of Birth: 1981-09-19  Date:  11/07/2017  Clinical Social Worker Initiating Note:  Laurey Arrow Date/Time: Initiated:  11/06/17/1430     Child's Name:  Alexandria Phillips   Biological Parents:  Mother(Per MOB, FOB will not be involved and is currently in [place].)   Need for Interpreter:  None   Reason for Referral:  Late or No Prenatal Care    Address:  [place] [address]    Phone number:  [phone] (home)     Additional phone number:  Household Members/Support Persons (HM/SP):   (MOB reports residing with MOB's sister (name unknown).)   HM/SP Name Relationship DOB or Age  HM/SP -1        HM/SP -2        HM/SP -3        HM/SP -4        HM/SP -5        HM/SP -6        HM/SP -7        HM/SP -8          Natural Supports (not living in the home):  Immediate Family, Community(MOB is connected and plans to participate with [facility] [employer]. )   Professional Supports:     Employment: Unemployed   Type of Work:     Education:  [place] arranged:    Pensions consultant:  (CSW provided MOB with information to apply for [employer], Physicist, medical, and Medicaid. )   Other Resources:      Cultural/Religious Considerations Which May Impact Care:  None Reported  Strengths:  Pediatrician chosen   Psychotropic Medications:         Pediatrician:    Solicitor area  Pediatrician List:   [facility] [place]      Pediatrician Fax Number:    Risk Factors/Current Problems:  None, Basic Needs    Cognitive State:  Able to Concentrate , Alert , Linear Thinking , Insightful    Mood/Affect:  Relaxed , Calm , Comfortable , Flat    CSW Assessment: CSW met with MOB in room  137 with Language Resource interpreter, Kerrin Champagne, to complete an assessment for [facility] and possible homelessness. MOB appeared flat initially during the assessment, however throughout the assessment her facial expression demonstrated happiness excitement about being a mother.  CSW explained CSW's role and with MOB's permission, CSW asked MOB's 2 female guest to leave the room in effort to speak with MOB in private.   CSW asked about MOB's current housing situation and MOB shared that MOB currently resides with MOB's sister. MOB denied being homeless, but communicated an interest in obtaining her own place in the near future. CSW reminded MOB that MOB can connect with [facility] [employer] [facility]) and the agency will assist her with securing safe an stable housing. MOB expressed hesitation regarding working with [employer].  MOB stated, "Someone told me the only thing [employer] can do is place me in a shelter with drug addicts." CSW informed MOB that the information that she obtained about [employer] was incorrect and explained the benefits of MOB participating with the program. MOB agreed to make contact with [employer] case manger Natasha.  CSW asked about MOB's [facility] care an explained the hospital's policy regarding limited [place]; MOB was understanding. CSW informed MOB that infant's UDS was negative and CSW will continue to monitor infant's CDS.  MOB is aware that if infant's CDS is positive without an explanations, CSW will make a report to [facility] CPS.   MOB communicated that MOB does not have all necessary basic items to care for infant.  CSW assisted MOB with logging in online to complete the tutorial for a free baby box.  CSW instructed MOB to provided MOB's bedside nurse with completion code after completing tutorial (MOB's interpreter agreed to assist MOB with completing tutorial). MOB also denied having a car seat.  CSW left a message with [employer] and requested that MOB pays $10 for a  car seat.   CSW provided SIDS education and MOB asked appropriate questions and responded appropriately to CSW's questions. CSW also provided education about PPD. MOB did not present with any acute MH symptoms and agreed to contact MOB's OB if a need arises.    CSW also requested a bundle pack from the hospital's security desk.  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

## 2017-11-07 NOTE — Lactation Note (Signed)
This note was copied from a baby's chart. Lactation Consultation Note  Patient Name: Alexandria Phillips ZOXWR'UToday's Date: 11/07/2017  I offered latch assist.  Mom states she has no milk and baby just had a bottle. Baby is sleeping in crib.  Discussed supply and demand.  Instructed to put baby to breast using nipple shield before giving formula.  Encouraged to call for assist prn.   Maternal Data    Feeding Feeding Type: Formula  LATCH Score                   Interventions    Lactation Tools Discussed/Used     Consult Status      Huston FoleyMOULDEN, Khriz Liddy S 11/07/2017, 9:35 AM

## 2017-11-10 ENCOUNTER — Inpatient Hospital Stay (HOSPITAL_COMMUNITY): Admission: RE | Admit: 2017-11-10 | Payer: Self-pay | Source: Ambulatory Visit

## 2017-12-16 ENCOUNTER — Ambulatory Visit: Payer: Self-pay | Admitting: Medical

## 2017-12-29 ENCOUNTER — Ambulatory Visit: Payer: Self-pay | Admitting: Advanced Practice Midwife
